# Patient Record
Sex: Male | Born: 1975 | Hispanic: No | Marital: Single | Smoking: Never smoker
Health system: Southern US, Community
[De-identification: ages and names within clinical notes are randomized; demographics above are authoritative.]

## PROBLEM LIST (undated history)

## (undated) ENCOUNTER — Ambulatory Visit (HOSPITAL_COMMUNITY): Disposition: A | Discharge status: Other Acute Inpatient Hospital | Payer: Self-pay

---

## 1999-05-27 ENCOUNTER — Emergency Department (HOSPITAL_COMMUNITY): Admission: EM | Admit: 1999-05-27 | Discharge: 1999-05-27 | Payer: Self-pay | Admitting: Emergency Medicine

## 2017-11-14 ENCOUNTER — Emergency Department (HOSPITAL_COMMUNITY): Payer: Worker's Compensation

## 2017-11-14 ENCOUNTER — Other Ambulatory Visit: Payer: Self-pay

## 2017-11-14 ENCOUNTER — Encounter (HOSPITAL_COMMUNITY): Payer: Self-pay | Admitting: *Deleted

## 2017-11-14 ENCOUNTER — Emergency Department (HOSPITAL_COMMUNITY)
Admission: EM | Admit: 2017-11-14 | Discharge: 2017-11-14 | Disposition: A | Discharge status: Home / Self Care | Payer: Worker's Compensation | Attending: Emergency Medicine | Admitting: Emergency Medicine

## 2017-11-14 DIAGNOSIS — Y9389 Activity, other specified: Secondary | ICD-10-CM | POA: Insufficient documentation

## 2017-11-14 DIAGNOSIS — S299XXA Unspecified injury of thorax, initial encounter: Secondary | ICD-10-CM | POA: Diagnosis present

## 2017-11-14 DIAGNOSIS — Y929 Unspecified place or not applicable: Secondary | ICD-10-CM | POA: Diagnosis not present

## 2017-11-14 DIAGNOSIS — Y990 Civilian activity done for income or pay: Secondary | ICD-10-CM | POA: Diagnosis not present

## 2017-11-14 DIAGNOSIS — Z79899 Other long term (current) drug therapy: Secondary | ICD-10-CM | POA: Insufficient documentation

## 2017-11-14 DIAGNOSIS — W1789XA Other fall from one level to another, initial encounter: Secondary | ICD-10-CM | POA: Diagnosis not present

## 2017-11-14 DIAGNOSIS — S2231XA Fracture of one rib, right side, initial encounter for closed fracture: Secondary | ICD-10-CM | POA: Insufficient documentation

## 2017-11-14 MED ORDER — OXYCODONE-ACETAMINOPHEN 5-325 MG PO TABS
2.0000 | ORAL_TABLET | Freq: Once | ORAL | Status: AC
  Administered 2017-11-14: 2 via ORAL
  Filled 2017-11-14: qty 2

## 2017-11-14 MED ORDER — IBUPROFEN 600 MG PO TABS: 600.0000 mg | ORAL_TABLET | Freq: Three times a day (TID) | ORAL | 0 refills | Status: DC | PRN

## 2017-11-14 MED ORDER — IBUPROFEN 400 MG PO TABS
600.0000 mg | ORAL_TABLET | Freq: Once | ORAL | Status: AC
  Administered 2017-11-14: 600 mg via ORAL
  Filled 2017-11-14: qty 1

## 2017-11-14 MED ORDER — OXYCODONE-ACETAMINOPHEN 5-325 MG PO TABS
1.0000 | ORAL_TABLET | ORAL | Status: DC | PRN
Start: 2017-11-14 — End: 2017-11-14
  Administered 2017-11-14: 1 via ORAL
  Filled 2017-11-14: qty 1

## 2017-11-14 MED ORDER — OXYCODONE-ACETAMINOPHEN 5-325 MG PO TABS: 1.0000 | ORAL_TABLET | ORAL | 0 refills | Status: AC | PRN

## 2017-11-14 NOTE — ED Provider Notes (Signed)
[facility]   CSN: [id number] Arrival date & time: 11/14/17  1026     History   Chief Complaint Chief Complaint  Patient presents with  . Fall    HPI Ricardo Mclean is a 42 y.o. male.  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  He fell 4 feet into a wall.  He complains of right lateral chest pain.  His pain is worse with deep breathing.  No shortness of breath.  Denies head injury or neck pain.  No other complaints.    History reviewed. No pertinent past medical history.  There are no active problems to display for this patient.   History reviewed. No pertinent surgical history.     Home Medications    Prior to Admission medications   Medication Sig Start Date End Date Taking? Authorizing Provider  ibuprofen (ADVIL,MOTRIN) 600 MG tablet Take 1 tablet (600 mg total) by mouth every 8 (eight) hours as needed. 11/14/17   Azalia Bilisampos, Mariacristina Aday, MD  oxyCODONE-acetaminophen (PERCOCET/ROXICET) 5-325 MG tablet Take 1 tablet by mouth every 4 (four) hours as needed for severe pain. 11/14/17   Azalia Bilisampos, Hassen Bruun, MD    Family History No family history on file.  Social History Social History   Tobacco Use  . Smoking status: Never Smoker  . Smokeless tobacco: Never Used  Substance Use Topics  . Alcohol use: Yes  . Drug use: No     Allergies   Patient has no allergy information on record.   Review of Systems Review of Systems  All other systems reviewed and are negative.    Physical Exam Updated Vital Signs BP 135/85   Pulse 93   Temp 98.5 F (36.9 C) (Oral)   Ht 5\' 4"  (1.626 m)   Wt 85.7 kg (189 lb)   SpO2 98%   BMI 32.44 kg/m   Physical Exam  Constitutional: He is oriented to person, place, and time. He appears well-developed and well-nourished.  HENT:  Head: Normocephalic.  Eyes: EOM are normal.  Neck: Normal range of motion.    Cardiovascular: Normal rate.  Pulmonary/Chest: Effort normal and breath sounds normal.  Abdominal: He exhibits no distension.  Musculoskeletal: Normal range of motion.  Tenderness right lateral chest wall without crepitus.  Neurological: He is alert and oriented to person, place, and time.  Psychiatric: He has a normal mood and affect.  Nursing note and vitals reviewed.    ED Treatments / Results  Labs (all labs ordered are listed, but only abnormal results are displayed) Labs Reviewed - No data to display  EKG  EKG Interpretation None       Radiology Dg Chest 2 View  Result Date: 11/14/2017 CLINICAL DATA:  Larey SeatFell off stilts injuring chest wall, pain and shortness of breath EXAM: CHEST  2 VIEW COMPARISON:  None FINDINGS: Upper normal size of cardiac silhouette. Mediastinal contours and pulmonary vascularity normal. Bibasilar atelectasis. No acute infiltrate, pleural effusion or pneumothorax. Fracture lateral RIGHT eighth rib with questionable adjacent fracture of the lateral RIGHT ninth rib. No additional fractures identified. IMPRESSION: Fractures of the RIGHT lateral eighth and questionably lateral ninth ribs. Bibasilar atelectasis. Electronically Signed   By: Ulyses SouthwardMark  Boles M.D.   On: 11/14/2017 11:12    Procedures Procedures (including critical care time)     ++++++++++++++++++++++++++++++++++++++++++++  Definitive Fracture Care  Definitive fracture care was performed for the patient's R rib fracture Treatment includes management  of pain Fracture related discharge instructions were provided Symptomatic control measures provided to the patient  +++++++++++++++++++++++++++++++++++++++++++++++++++      Medications Ordered in ED Medications  oxyCODONE-acetaminophen (PERCOCET/ROXICET) 5-325 MG per tablet 1 tablet (1 tablet Oral Given 11/14/17 1043)  oxyCODONE-acetaminophen (PERCOCET/ROXICET) 5-325 MG per tablet 2 tablet (2 tablets Oral Given 11/14/17 1155)  ibuprofen  (ADVIL,MOTRIN) tablet 600 mg (600 mg Oral Given 11/14/17 1155)     Initial Impression / Assessment and Plan / ED Course  I have reviewed the triage vital signs and the nursing notes.  Pertinent labs & imaging results that were available during my care of the patient were reviewed by me and considered in my medical decision making (see chart for details).     Patient with isolated right rib fracture.  Definitive fracture care performed.  Home with pain medicine and incentive spirometry.  Final Clinical Impressions(s) / ED Diagnoses   Final diagnoses:  Closed fracture of one rib of right side, initial encounter    ED Discharge Orders        Ordered    ibuprofen (ADVIL,MOTRIN) 600 MG tablet  Every 8 hours PRN     11/14/17 1232    oxyCODONE-acetaminophen (PERCOCET/ROXICET) 5-325 MG tablet  Every 4 hours PRN     11/14/17 1232       Azalia Bilis, MD 11/14/17 1237

## 2017-11-14 NOTE — ED Triage Notes (Signed)
States he was at work and was walking on stilts and fell approx. 4 ft into a wall. C/o right lateral chest pain

## 2017-11-18 ENCOUNTER — Other Ambulatory Visit: Payer: Self-pay | Admitting: Family Medicine

## 2017-11-18 ENCOUNTER — Ambulatory Visit
Admission: RE | Admit: 2017-11-18 | Discharge: 2017-11-18 | Disposition: A | Discharge status: Home / Self Care | Payer: Worker's Compensation | Source: Ambulatory Visit | Attending: Family Medicine | Admitting: Family Medicine

## 2017-11-18 DIAGNOSIS — T1490XA Injury, unspecified, initial encounter: Secondary | ICD-10-CM

## 2017-12-30 ENCOUNTER — Ambulatory Visit: Payer: Worker's Compensation

## 2017-12-30 ENCOUNTER — Other Ambulatory Visit: Payer: Self-pay | Admitting: Family Medicine

## 2017-12-30 DIAGNOSIS — R0781 Pleurodynia: Secondary | ICD-10-CM

## 2018-06-06 ENCOUNTER — Emergency Department (HOSPITAL_COMMUNITY)
Admission: EM | Admit: 2018-06-06 | Discharge: 2018-06-06 | Disposition: A | Discharge status: Home / Self Care | Payer: Self-pay

## 2018-06-06 NOTE — ED Notes (Signed)
No reply for triage x4

## 2018-06-06 NOTE — ED Notes (Signed)
Unable to locate pt. Multiple times by staff .  

## 2018-06-07 ENCOUNTER — Encounter (HOSPITAL_COMMUNITY): Payer: Self-pay | Admitting: Emergency Medicine

## 2018-06-07 ENCOUNTER — Emergency Department (HOSPITAL_COMMUNITY)
Admission: EM | Admit: 2018-06-07 | Discharge: 2018-06-07 | Disposition: A | Discharge status: Home / Self Care | Payer: Self-pay | Attending: Emergency Medicine | Admitting: Emergency Medicine

## 2018-06-07 DIAGNOSIS — Z23 Encounter for immunization: Secondary | ICD-10-CM | POA: Insufficient documentation

## 2018-06-07 DIAGNOSIS — S61311A Laceration without foreign body of left index finger with damage to nail, initial encounter: Secondary | ICD-10-CM

## 2018-06-07 DIAGNOSIS — W260XXA Contact with knife, initial encounter: Secondary | ICD-10-CM | POA: Insufficient documentation

## 2018-06-07 DIAGNOSIS — Y999 Unspecified external cause status: Secondary | ICD-10-CM | POA: Insufficient documentation

## 2018-06-07 DIAGNOSIS — Y929 Unspecified place or not applicable: Secondary | ICD-10-CM | POA: Insufficient documentation

## 2018-06-07 DIAGNOSIS — S68621A Partial traumatic transphalangeal amputation of left index finger, initial encounter: Secondary | ICD-10-CM | POA: Insufficient documentation

## 2018-06-07 DIAGNOSIS — Y9389 Activity, other specified: Secondary | ICD-10-CM | POA: Insufficient documentation

## 2018-06-07 MED ORDER — LIDOCAINE HCL 2 % IJ SOLN
20.0000 mL | Freq: Once | INTRAMUSCULAR | Status: AC
  Administered 2018-06-07: 400 mg
  Filled 2018-06-07: qty 20

## 2018-06-07 MED ORDER — TETANUS-DIPHTH-ACELL PERTUSSIS 5-2.5-18.5 LF-MCG/0.5 IM SUSP
0.5000 mL | Freq: Once | INTRAMUSCULAR | Status: AC
  Administered 2018-06-07: 0.5 mL via INTRAMUSCULAR
  Filled 2018-06-07: qty 0.5

## 2018-06-07 NOTE — ED Provider Notes (Signed)
[facility] EMERGENCY DEPARTMENT Provider Note   CSN: [id number] Arrival date & time: 06/07/18  1137     History   Chief Complaint Chief Complaint  Patient presents with  . Finger Laceration    HPI Ricardo Mclean is a 42 y.o. male.  HPI   42 year old right-hand dominant male presents today with complaints of laceration to his left index finger. Patient notes around 8:30 PM last night he was using a knife to cut a line for his weed eater. He cut off the radial distal end of his fingertip. Patient notes bleeding as persisted. He denies any other injuries. Last tetanus was approximately 8 years ago.  History reviewed. No pertinent past medical history.  There are no active problems to display for this patient.   History reviewed. No pertinent surgical history.     Home Medications    Prior to Admission medications   Medication Sig Start Date End Date Taking? Authorizing Provider  ibuprofen (ADVIL,MOTRIN) 600 MG tablet Take 1 tablet (600 mg total) by mouth every 8 (eight) hours as needed. 11/14/17   Azalia Bilis, MD  oxyCODONE-acetaminophen (PERCOCET/ROXICET) 5-325 MG tablet Take 1 tablet by mouth every 4 (four) hours as needed for severe pain. 11/14/17   Azalia Bilis, MD    Family History No family history on file.  Social History Social History   Tobacco Use  . Smoking status: Never Smoker  . Smokeless tobacco: Never Used  Substance Use Topics  . Alcohol use: Yes    Comment: occ  . Drug use: No     Allergies   Patient has no known allergies.   Review of Systems Review of Systems  All other systems reviewed and are negative.    Physical Exam Updated Vital Signs BP 137/81   Pulse 79   Temp 98.2 F (36.8 C) (Oral)   Resp 18   SpO2 97%   Physical Exam  Constitutional: He is oriented to person, place, and time. He appears well-developed and well-nourished.  HENT:  Head: Normocephalic and atraumatic.  Eyes: Pupils are equal,  round, and reactive to light. Conjunctivae are normal. Right eye exhibits no discharge. Left eye exhibits no discharge. No scleral icterus.  Neck: Normal range of motion. No JVD present. No tracheal deviation present.  Pulmonary/Chest: Effort normal. No stridor.  Neurological: He is alert and oriented to person, place, and time. Coordination normal.  Psychiatric: He has a normal mood and affect. His behavior is normal. Judgment and thought content normal.  Nursing note and vitals reviewed.      ED Treatments / Results  Labs (all labs ordered are listed, but only abnormal results are displayed) Labs Reviewed - No data to display  EKG None  Radiology No results found.  Procedures Procedures (including critical care time)  Medications Ordered in ED Medications  lidocaine (XYLOCAINE) 2 % (with pres) injection 400 mg (400 mg Infiltration Given 06/07/18 1332)  Tdap (BOOSTRIX) injection 0.5 mL (0.5 mLs Intramuscular Given 06/07/18 1331)     Initial Impression / Assessment and Plan / ED Course  I have reviewed the triage vital signs and the nursing notes.  Pertinent labs & imaging results that were available during my care of the patient were reviewed by me and considered in my medical decision making (see chart for details).     Labs:   Imaging:  Consults:  Therapeutics: TDAP  Discharge Meds:   Assessment/Plan: 42 year old male presents today with partial amputation of the distal finger. This  does not extend to the bone, bleeding was controlled with direct pressure and bandaging here. Anticipate wound will hear on its own by secondary intention, patient can follow-up with orthopedic hand specialist as needed. He is given strict return cautious, he verbalized understanding and agreement to today's plan had no further questions or concerns.   Final Clinical Impressions(s) / ED Diagnoses   Final diagnoses:  Laceration of left index finger without foreign body with damage to  nail, initial encounter    ED Discharge Orders    None       Eyvonne Mechanic, PA-C 06/07/18 1516    Sabas Sous, MD 06/07/18 2250

## 2018-06-07 NOTE — Discharge Instructions (Addendum)
Please read attached information. If you experience any new or worsening signs or symptoms please return to the emergency room for evaluation. Please follow-up with your primary care provider or specialist as discussed.  °

## 2018-06-07 NOTE — ED Triage Notes (Signed)
Patient to ED c/o laceration to L index finger - cut tip of finger off with knife - about 8:30pm last night. Reports wound is still bleeding.

## 2019-11-05 ENCOUNTER — Ambulatory Visit (INDEPENDENT_AMBULATORY_CARE_PROVIDER_SITE_OTHER): Payer: Worker's Compensation

## 2019-11-05 ENCOUNTER — Other Ambulatory Visit: Payer: Self-pay

## 2019-11-05 ENCOUNTER — Encounter (HOSPITAL_COMMUNITY): Payer: Self-pay

## 2019-11-05 ENCOUNTER — Ambulatory Visit (HOSPITAL_COMMUNITY): Payer: Self-pay

## 2019-11-05 ENCOUNTER — Ambulatory Visit (HOSPITAL_COMMUNITY)
Admission: EM | Admit: 2019-11-05 | Discharge: 2019-11-05 | Disposition: A | Discharge status: Home / Self Care | Payer: Worker's Compensation | Attending: Family Medicine | Admitting: Family Medicine

## 2019-11-05 DIAGNOSIS — M25571 Pain in right ankle and joints of right foot: Secondary | ICD-10-CM | POA: Diagnosis not present

## 2019-11-05 MED ORDER — IBUPROFEN 800 MG PO TABS: 800.0000 mg | ORAL_TABLET | Freq: Three times a day (TID) | ORAL | 0 refills | Status: AC

## 2019-11-05 NOTE — ED Triage Notes (Signed)
Patient presents to Urgent Care with complaints of right ankle pain since tripping and falling this morning while at work. Patient reports he did not hit his head.

## 2019-11-05 NOTE — ED Provider Notes (Signed)
[facility]   [id number] 11/05/19 Arrival Time: 8546  ASSESSMENT & PLAN:  1. Acute right ankle pain     I have personally viewed the imaging studies ordered this visit. No fractures appreciated. Will treat as a sprain. Discussed. See AVS for d/c instructions. Placed in ASO. WBAT.   Meds ordered this encounter  Medications  . ibuprofen (ADVIL) 800 MG tablet    Sig: Take 1 tablet (800 mg total) by mouth 3 (three) times daily with meals.    Dispense:  21 tablet    Refill:  0    Recommend:   Follow-up Information    [place].   Why: If worsening or failing to improve as anticipated. Contact information: [address] [place] [phone]          Reviewed expectations re: course of current medical issues. Questions answered. Outlined signs and symptoms indicating need for more acute intervention. Patient verbalized understanding. After Visit Summary given.  SUBJECTIVE: History from: patient. Ricardo Mclean is a 44 y.o. male who reports fairly persistent moderate pain of his right lateral ankle; described as aching; without radiation. Onset: abrupt. First noted: today. Injury/trama: reports tripping at work; inversion of ankle; immediate pain; has been able to bear weight since but with reported discomfort; now some swelling. Symptoms have progressed to a point and plateaued since beginning. Aggravating factors: certain movements and weight bearing. Alleviating factors: have not been identified. Associated symptoms: none reported. Extremity sensation changes or weakness: none. Self treatment: has not tried OTC therapies.  History of similar: none reported.  History reviewed. No pertinent surgical history.    OBJECTIVE:  Vitals:   11/05/19 1214  BP: 124/78  Pulse: 67  Resp: 16  Temp: 98 F (36.7 C)  TempSrc: Oral  SpO2: 97%    General appearance: alert; no distress HEENT: [place];  AT Neck: supple with FROM Resp: unlabored respirations Extremities: . RLE: warm with well perfused appearance; poorly localized moderate tenderness over right lateral ankle anterior and posterior; without gross deformities; swelling: moderate; bruising: none; ankle ROM: limited by reported pain CV: brisk extremity capillary refill of RLE; 2+ DP/PT pulses of RLE. Skin: warm and dry; no visible rashes Neurologic: normal sensation and strength of RLE Psychological: alert and cooperative; normal mood and affect  Imaging: DG Ankle Complete Right  Result Date: 11/05/2019 CLINICAL DATA:  Right ankle pain secondary to a twisting injury and fall this morning. EXAM: RIGHT ANKLE - COMPLETE 3+ VIEW COMPARISON:  None. FINDINGS: There is no fracture or dislocation. Slight soft tissue swelling around the ankle. Ankle effusion. IMPRESSION: No acute bone abnormality. Ankle effusion. Electronically Signed   By: Lorriane Shire M.D.   On: 11/05/2019 12:36     No Known Allergies  History reviewed. No pertinent past medical history.   Social History   Socioeconomic History  . Marital status: Single    Spouse name: Not on file  . Number of children: Not on file  . Years of education: Not on file  . Highest education level: Not on file  Occupational History  . Not on file  Tobacco Use  . Smoking status: Never Smoker  . Smokeless tobacco: Never Used  Substance and Sexual Activity  . Alcohol use: Yes    Comment: occ  . Drug use: No  . Sexual activity: Not on file  Other Topics Concern  . Not on file  Social History Narrative  . Not on file  Social Determinants of Health   Financial Resource Strain:   . Difficulty of Paying Living Expenses: Not on file  Food Insecurity:   . Worried About Programme researcher, broadcasting/film/video in the Last Year: Not on file  . Ran Out of Food in the Last Year: Not on file  Transportation Needs:   . Lack of Transportation (Medical): Not on file  . Lack of Transportation  (Non-Medical): Not on file  Physical Activity:   . Days of Exercise per Week: Not on file  . Minutes of Exercise per Session: Not on file  Stress:   . Feeling of Stress : Not on file  Social Connections:   . Frequency of Communication with Friends and Family: Not on file  . Frequency of Social Gatherings with Friends and Family: Not on file  . Attends Religious Services: Not on file  . Active Member of Clubs or Organizations: Not on file  . Attends Banker Meetings: Not on file  . Marital Status: Not on file   Family History  Problem Relation Age of Onset  . Healthy Mother   . Healthy Father    History reviewed. No pertinent surgical history.    Mardella Layman, MD 11/07/19 [phone]

## 2021-07-26 IMAGING — DX DG ANKLE COMPLETE 3+V*R*
3 series · 3 of 3 positions shown · non-contrast
Comparison: None.

CLINICAL DATA: Right ankle pain secondary to a twisting injury and
fall this morning.

EXAM:
RIGHT ANKLE - COMPLETE 3+ VIEW

[ankle ap]
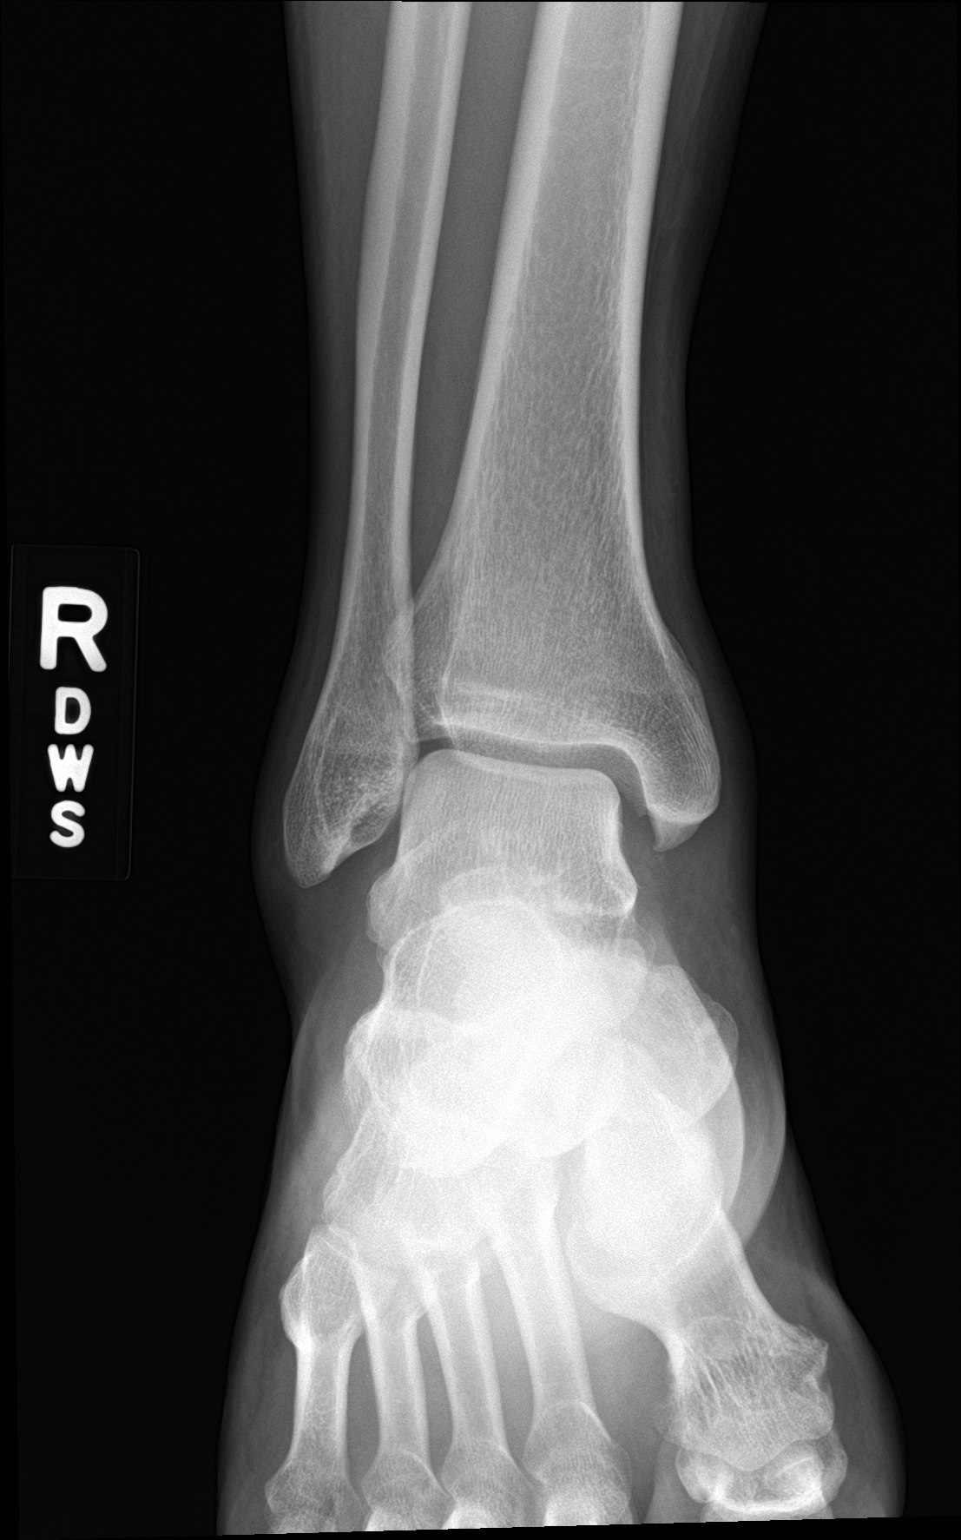

[ankle obl]
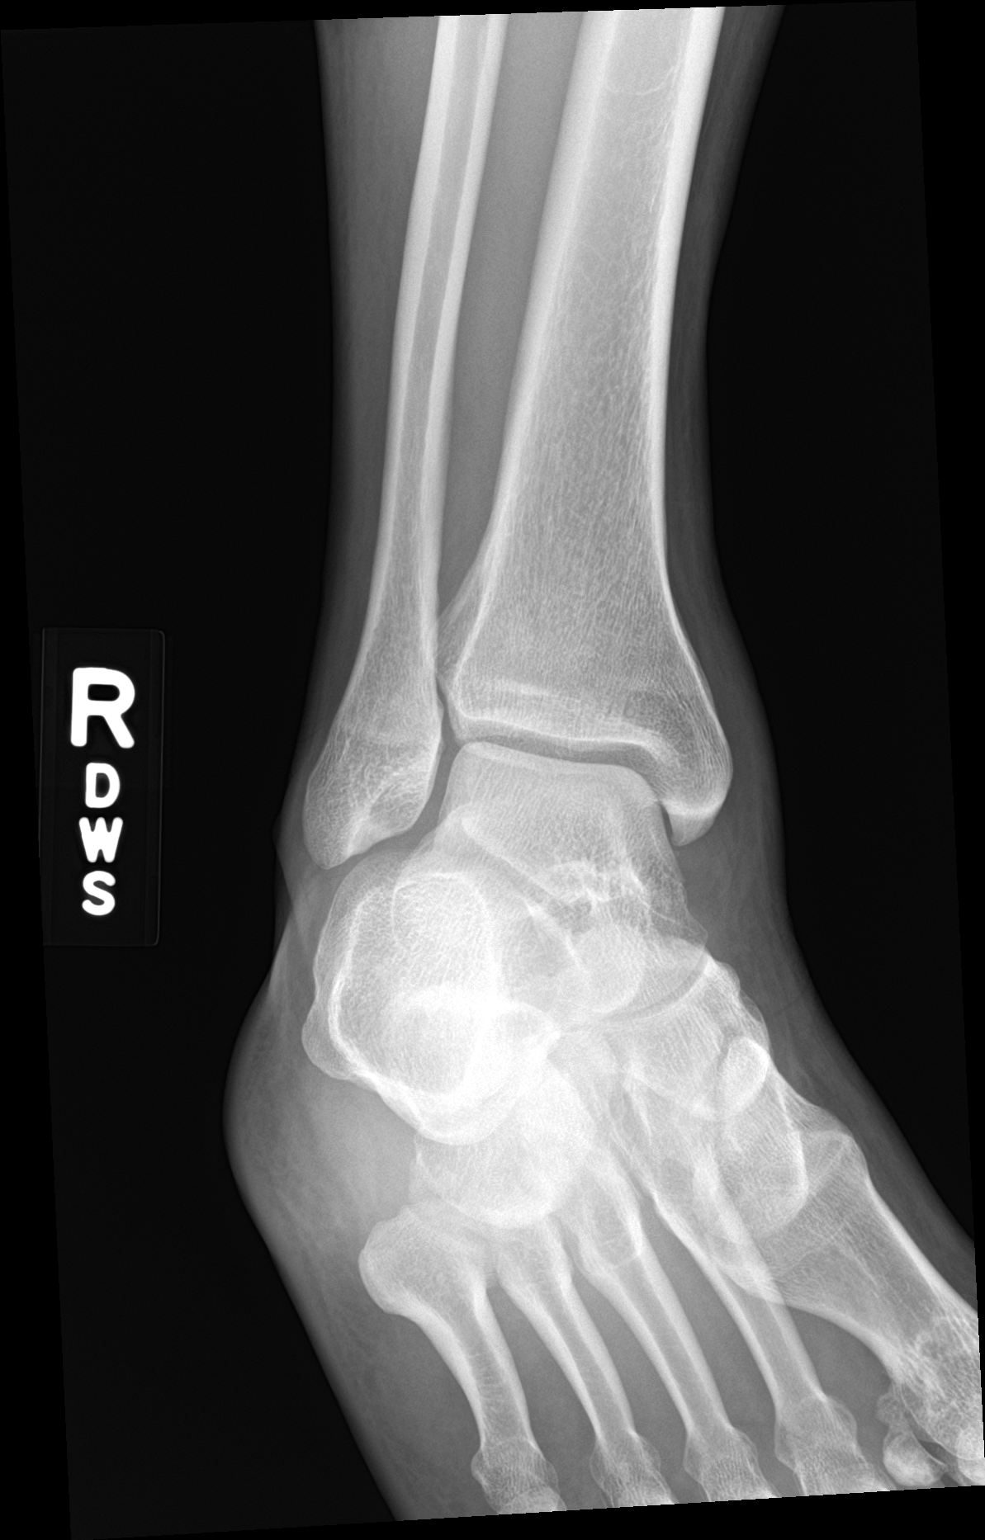

[ankle lat]
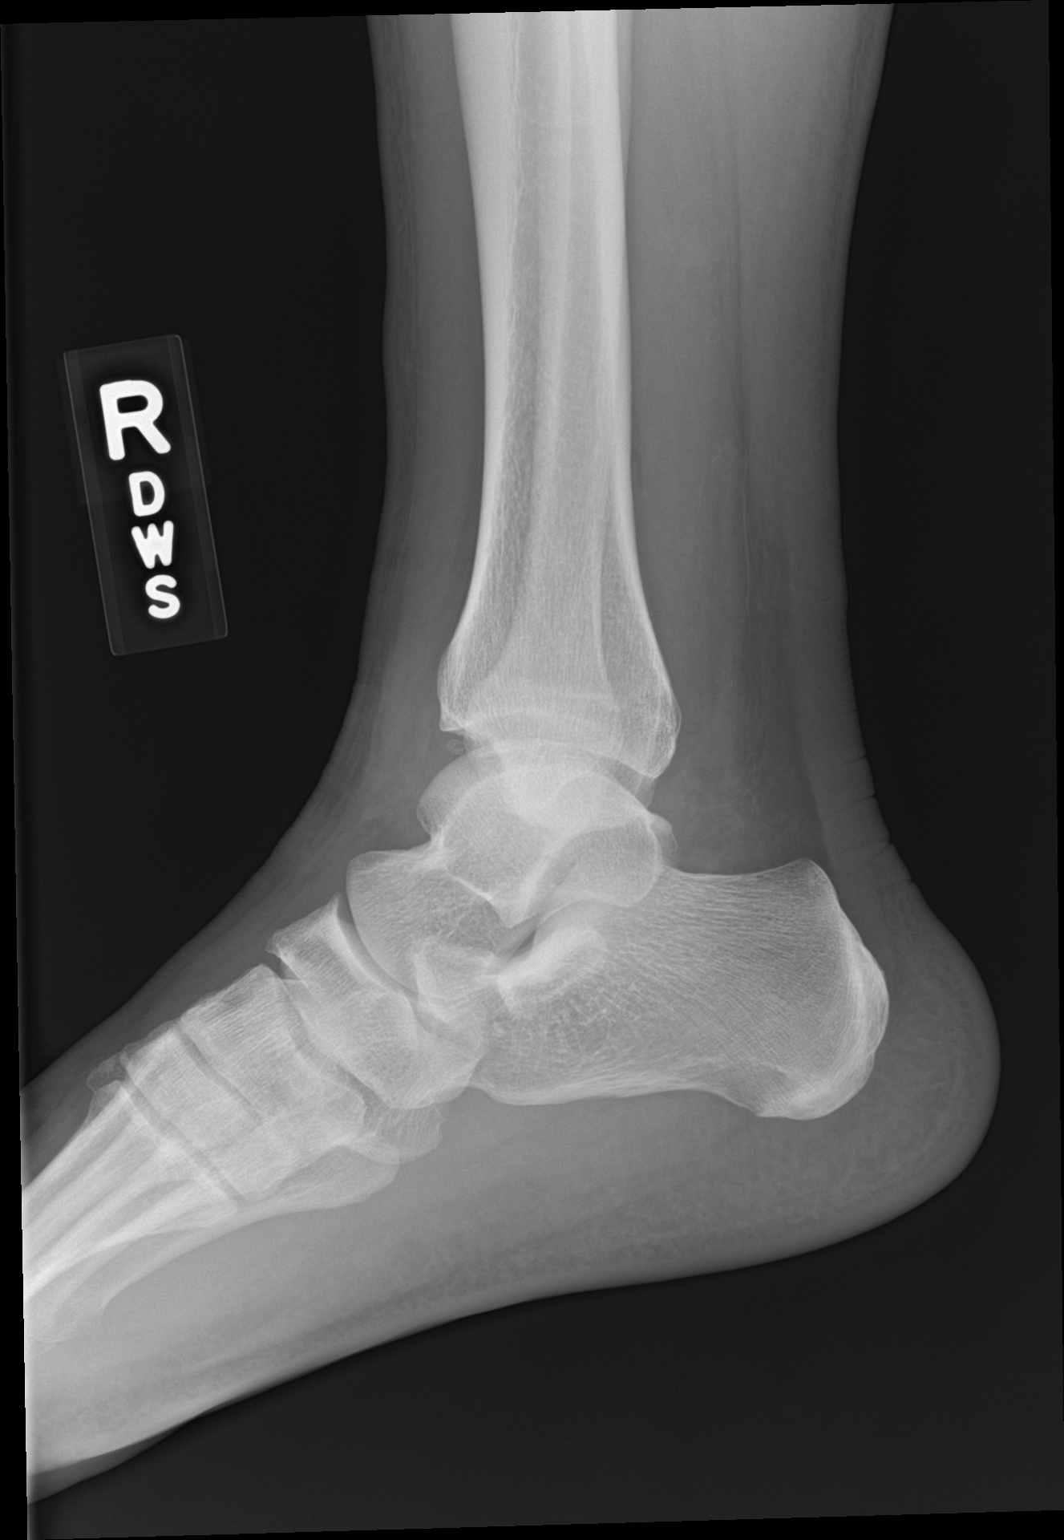

[3 of 3 positions shown; findings below may reference images not displayed]

FINDINGS: There is no fracture or dislocation. Slight soft tissue swelling
around the ankle. Ankle effusion.
IMPRESSION: No acute bone abnormality. Ankle effusion.

## 2023-01-14 ENCOUNTER — Ambulatory Visit (INDEPENDENT_AMBULATORY_CARE_PROVIDER_SITE_OTHER): Payer: Self-pay | Admitting: Orthopaedic Surgery

## 2023-01-14 ENCOUNTER — Other Ambulatory Visit (INDEPENDENT_AMBULATORY_CARE_PROVIDER_SITE_OTHER): Payer: Self-pay

## 2023-01-14 DIAGNOSIS — M20011 Mallet finger of right finger(s): Secondary | ICD-10-CM

## 2023-01-14 NOTE — Progress Notes (Addendum)
   Office Visit Note   Patient: Ricardo Mclean           Date of Birth: [birth year]           MRN: [id number] Visit Date: 01/14/2023              Requested by: No referring provider defined for this encounter. PCP: Patient, No Pcp Per   Assessment & Plan: Visit Diagnoses:  1. 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  Will send a referral to OT for splinting.  Will plan to splint for a continuous weeks.  Follow-up in 8 weeks for recheck.  Follow-Up Instructions: Return in about 8 weeks (around 03/11/2023).   Orders:  Orders Placed This Encounter  Procedures   XR Finger Ring Right   Ambulatory referral to Occupational Therapy   No orders of the defined types were placed in this encounter.     Procedures: No procedures performed   Clinical Data: No additional findings.   Subjective: Chief Complaint  Patient presents with   Right Hand - Injury    Ring finger    HPI  Patient is a 47 year old Hispanic gentleman here with interpreter.  Injured his right ring finger about a month ago at work.  Since then he has had extensor lag of the DIP joint.  He has been wearing an AlumaFoam splint.  Review of Systems   Objective: Vital Signs: There were no vitals taken for this visit.  Physical Exam  Ortho Exam  Examination right ring finger shows extensor lag of the DIP joint.  He has normal flexion.  Collateral ligaments are intact.  Specialty Comments:  No specialty comments available.  Imaging: XR Finger Ring Right  Result Date: 01/14/2023 No acute or structural abnormalities.    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  Family History  Problem Relation Age of Onset   Healthy Mother    Healthy Father     No past surgical history on file. Social History    Occupational History   Not on file  Tobacco Use   Smoking status: Never   Smokeless tobacco: Never  Vaping Use   Vaping Use: Never used  Substance and Sexual Activity   Alcohol use: Yes    Comment: occ   Drug use: No   Sexual activity: Not on file

## 2023-01-24 NOTE — Therapy (Signed)
OUTPATIENT OCCUPATIONAL THERAPY ORTHO EVALUATION  Patient Name: Ricardo Mclean MRN: [id number] DOB:1976/02/03, 47 y.o., male Today's Date: 01/25/2023  PCP: N/A REFERRING PROVIDER:  Tarry Kos, MD    END OF SESSION:  OT End of Session - 01/25/23 1020     Visit Number 1    Number of Visits 6    Date for OT Re-Evaluation 04/22/23    Authorization Type N/A    OT Start Time 1020    OT Stop Time 1112    OT Time Calculation (min) 52 min    Equipment Utilized During Treatment orthotic materials    Activity Tolerance Patient tolerated treatment well;No increased pain;Patient limited by pain    Behavior During Therapy [facility] for tasks assessed/performed             History reviewed. No pertinent past medical history. History reviewed. No pertinent surgical history. 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 He states injuring his hand at work about 2 months ago.  He works in [employer] and he is also requesting a note telling his supervisor that he should not be using his right hand forcefully now.  He is having pain trying to make a fist and use his hand for activities at work, and he states the most swelling and pain at the end of the day after a long day at work.Marland Kitchen    PERTINENT HISTORY: Rt RF mallet injury ~ 1-2 months old. Needs orthosis.   PRECAUTIONS: None  WEIGHT BEARING RESTRICTIONS: Yes not strong, painful gripping recommended in Rt hand for 8-10  weeks  PAIN:  Are you having pain? Yes: NPRS scale: 5/10 at rest now but after work up to 7-8/10 Pain location: Rt RF Pain description: aching,throbbing at times Aggravating factors: hard gripping/work Relieving factors: rest, ice  FALLS: Has patient fallen in last 6 months? No  LIVING ENVIRONMENT: Lives with: lives with their family  PLOF: Independent  PATIENT GOALS: to improve use of Rt hand   NEXT MD VISIT: ~8 weeks    OBJECTIVE: (All objective assessments below are from initial evaluation on: 01/25/23 unless otherwise specified.)   HAND DOMINANCE: Right   ADLs: Overall ADLs: States decreased ability to grab, hold household objects, pain and inability to open containers, perform FMS tasks (manipulate fasteners on clothing)   FUNCTIONAL OUTCOME MEASURES: Eval: Quck DASH TBD as needed next session % impairment today  (Higher % Score  =  More Impairment)     UPPER EXTREMITY ROM      Hand AROM Right eval  Full Fist Ability (or Gap to Distal Palmar Crease) unable  Thumb Opposition  (Kapandji Scale)  full  Ring MCP (0-90) 0 - 72  Ring PIP (0-100) (+3) - 90  Ring DIP (0-70) (-29*) -  62  (Blank rows = not tested)   UPPER EXTREMITY MMT:    Eval:  NT at eval due to recent and still healing injuries. Will be tested when appropriate.   MMT Right TBD  Elbow flexion  Elbow extension   Forearm supination   Forearm pronation   Wrist flexion   Wrist extension   (Blank rows = not tested)  HAND FUNCTION: Eval: Observed weakness in affected hand. TBD when safe  Grip strength Right: TBD lbs, Left: TBD lbs   COORDINATION: Eval: Observed coordination impairments with affected Rt hand. Details TBD  SENSATION: Eval:  Light touch intact today  EDEMA:   Eval: Mildly swollen in Rt hand RF today  COGNITION: Eval: Overall cognitive status: WFL for evaluation today   OBSERVATIONS:   Eval: He has apparent right hand ring finger mallet injury.  It is somewhat red and  swollen at the DIP joint, as he has been continuing to work push and pull with his hand and it has been painful to him.  Unfortunately he is also started to develop somewhat of a PIP hyperextension such as a "swan-neck" deformity.   TODAY'S TREATMENT:  Post-evaluation treatment: Custom orthotic fabrication was indicated due to pt's injured Rt RF terminal tendon and need for safe, functional positioning. OT fabricated 3 custom mallet finger orthoses for pt today to support DIP J in full extension. Multiple were required so that he can wear in the shower, then change to new, dry orthosis. He also has a backup if his work orthosis is damaged or lost. They all fit well with no areas of pressure, pt states a comfortable fit. Pt was educated on the wearing schedule (at all times), to call or come in ASAP if it is causing any irritation or is not achieving desired function. It will be checked/adjusted in upcoming sessions, as needed. Pt states understanding.   OT thoroughly explained that he should not be bending the tip of his ring finger or it may not heal ever.  He was shown how to safely don and doff his orthoses without bending the tip of his finger.  He states understanding. For self-care/safety he was also recommended not to perform any strong repetitive painful gripping with the right hand until told by the doctor or therapist.  He was provided a work note stating that he was here and recommended to wear an orthotic at all times and not perform heavy or painful gripping with the right hand now.   PATIENT EDUCATION: Education details: See tx section above for details  Person educated: Patient Education method: Verbal Instruction, Teach back, Handouts  Education comprehension: States and demonstrates understanding, Additional Education required    HOME EXERCISE PROGRAM: See tx section above for details    GOALS: Goals reviewed with patient? Yes   SHORT TERM GOALS: (STG required if POC>30  days) Target Date: 03/04/23  Pt will obtain protective, custom orthotic. Goal status: MET   2.  Pt will demo/state understanding of initial HEP to improve pain levels and prerequisite motion. Goal status: INITIAL   LONG TERM GOALS: Target Date: 04/22/23  Pt will improve functional ability by decreased impairment per Quick DASH assessment from TBD to TBD or better, for better quality of life. Goal status: INITIAL- no time to perform at eval today   2.  Pt will improve grip strength in Rt hand from TBDlbs to at least TBDlbs for functional use at home and in IADLs. Goal status: INITIAL- unsafe to do at eval  3.  Pt will improve A/ROM in Rt RF TAM from 198* to at least 220*, to have functional motion for tasks like reach and grasp.  Goal status: INITIAL  4.   Pt will decrease pain at  worst from 7-8/10 to 1-2/10 or better to have better sleep and occupational participation in daily roles. Goal status: INITIAL  ASSESSMENT:  CLINICAL IMPRESSION: Patient is a 47 y.o. male who was seen today for occupational therapy evaluation for right hand injury and decreased function. He will benefit from OP OT to increase function in Right hand, and orthotics will be necessary to help him heal.    PERFORMANCE DEFICITS: in functional skills including ADLs, IADLs, dexterity, edema, ROM, strength, pain, fascial restrictions, flexibility, Fine motor control, body mechanics, endurance, decreased knowledge of precautions, and UE functional use, cognitive skills including problem solving and safety awareness, and psychosocial skills including coping strategies, environmental adaptation, habits, and routines and behaviors.   IMPAIRMENTS: are limiting patient from ADLs, IADLs, and work.   COMORBIDITIES: may have co-morbidities  that affects occupational performance. Patient will benefit from skilled OT to address above impairments and improve overall function.  MODIFICATION OR ASSISTANCE TO COMPLETE EVALUATION:  No modification of tasks or assist necessary to complete an evaluation.  OT OCCUPATIONAL PROFILE AND HISTORY: Problem focused assessment: Including review of records relating to presenting problem.  CLINICAL DECISION MAKING: LOW - limited treatment options, no task modification necessary  REHAB POTENTIAL: Good  EVALUATION COMPLEXITY: Low      PLAN:  OT FREQUENCY: 3 visits in the first 2-5 weeks as needed to provide orthoses that fit well and help support the injured finger. He can return as needed for adjustments due to changes in swelling, and eventually start AROM once MD clears in ~6-8 weeks.  At that point he can be seen 1-2 / week for 1-4 weeks as needed.   OT DURATION: 12 weeks (this includes "healing time" out of therapy in orthosis  PLANNED INTERVENTIONS: self care/ADL training, therapeutic exercise, therapeutic activity, manual therapy, passive range of motion, splinting, compression bandaging, moist heat, cryotherapy, contrast bath, patient/family education, coping strategies training, DME and/or AE instructions, and Dry needling  RECOMMENDED OTHER SERVICES: none now  CONSULTED AND AGREED WITH PLAN OF CARE: Patient  PLAN FOR NEXT SESSION: f/u to check orthoses PRN in next 6-8 weeks, then f/u more frequently to work on regaining ROM safety and then strength PRN   Fannie Knee, OTR/L, CHT 01/25/2023, 3:12 PM

## 2023-01-25 ENCOUNTER — Ambulatory Visit (INDEPENDENT_AMBULATORY_CARE_PROVIDER_SITE_OTHER): Payer: Self-pay | Admitting: Rehabilitative and Restorative Service Providers"

## 2023-01-25 ENCOUNTER — Encounter: Payer: Self-pay | Admitting: Rehabilitative and Restorative Service Providers"

## 2023-01-25 ENCOUNTER — Other Ambulatory Visit: Payer: Self-pay

## 2023-01-25 DIAGNOSIS — M25641 Stiffness of right hand, not elsewhere classified: Secondary | ICD-10-CM

## 2023-01-25 DIAGNOSIS — R278 Other lack of coordination: Secondary | ICD-10-CM

## 2023-01-25 DIAGNOSIS — M25541 Pain in joints of right hand: Secondary | ICD-10-CM

## 2023-01-25 DIAGNOSIS — R600 Localized edema: Secondary | ICD-10-CM

## 2023-01-25 DIAGNOSIS — M6281 Muscle weakness (generalized): Secondary | ICD-10-CM

## 2024-03-16 ENCOUNTER — Telehealth (INDEPENDENT_AMBULATORY_CARE_PROVIDER_SITE_OTHER): Payer: Self-pay | Admitting: Primary Care

## 2024-03-16 NOTE — Telephone Encounter (Signed)
 Called pt to confirm appt. Pt stated that he may not be present.

## 2024-03-19 ENCOUNTER — Ambulatory Visit (INDEPENDENT_AMBULATORY_CARE_PROVIDER_SITE_OTHER): Payer: Self-pay | Admitting: Primary Care
